# Patient Record
Sex: Male | Born: 1965 | Race: White | Hispanic: No | Marital: Single | Smoking: Current every day smoker
Health system: Southern US, Community
[De-identification: ages and names within clinical notes are randomized; demographics above are authoritative.]

## PROBLEM LIST (undated history)

## (undated) HISTORY — PX: EXTERNAL EAR SURGERY: SHX627

## (undated) HISTORY — PX: FRACTURE SURGERY: SHX138

---

## 2015-02-19 ENCOUNTER — Emergency Department (HOSPITAL_COMMUNITY)

## 2015-02-19 ENCOUNTER — Emergency Department (HOSPITAL_COMMUNITY)
Admission: EM | Admit: 2015-02-19 | Discharge: 2015-02-19 | Disposition: A | Discharge status: Home / Self Care | Attending: Emergency Medicine | Admitting: Emergency Medicine

## 2015-02-19 ENCOUNTER — Encounter (HOSPITAL_COMMUNITY): Payer: Self-pay | Admitting: *Deleted

## 2015-02-19 DIAGNOSIS — Y9368 Activity, volleyball (beach) (court): Secondary | ICD-10-CM | POA: Insufficient documentation

## 2015-02-19 DIAGNOSIS — Y9289 Other specified places as the place of occurrence of the external cause: Secondary | ICD-10-CM | POA: Diagnosis not present

## 2015-02-19 DIAGNOSIS — S52501A Unspecified fracture of the lower end of right radius, initial encounter for closed fracture: Secondary | ICD-10-CM

## 2015-02-19 DIAGNOSIS — W1839XA Other fall on same level, initial encounter: Secondary | ICD-10-CM | POA: Diagnosis not present

## 2015-02-19 DIAGNOSIS — Z880 Allergy status to penicillin: Secondary | ICD-10-CM | POA: Diagnosis not present

## 2015-02-19 DIAGNOSIS — S52591A Other fractures of lower end of right radius, initial encounter for closed fracture: Secondary | ICD-10-CM | POA: Insufficient documentation

## 2015-02-19 DIAGNOSIS — S52611A Displaced fracture of right ulna styloid process, initial encounter for closed fracture: Secondary | ICD-10-CM | POA: Insufficient documentation

## 2015-02-19 DIAGNOSIS — S6991XA Unspecified injury of right wrist, hand and finger(s), initial encounter: Secondary | ICD-10-CM | POA: Diagnosis present

## 2015-02-19 DIAGNOSIS — Y998 Other external cause status: Secondary | ICD-10-CM | POA: Diagnosis not present

## 2015-02-19 DIAGNOSIS — Z720 Tobacco use: Secondary | ICD-10-CM | POA: Insufficient documentation

## 2015-02-19 DIAGNOSIS — Q7191 Unspecified reduction defect of right upper limb: Secondary | ICD-10-CM | POA: Diagnosis not present

## 2015-02-19 MED ORDER — OXYCODONE-ACETAMINOPHEN 5-325 MG PO TABS
2.0000 | ORAL_TABLET | Freq: Once | ORAL | Status: AC
  Administered 2015-02-19: 2 via ORAL
  Filled 2015-02-19: qty 2

## 2015-02-19 MED ORDER — OXYCODONE-ACETAMINOPHEN 5-325 MG PO TABS: 1.0000 | ORAL_TABLET | Freq: Once | ORAL | Status: AC

## 2015-02-19 MED ORDER — BUPIVACAINE HCL (PF) 0.25 % IJ SOLN
10.0000 mL | Freq: Once | INTRAMUSCULAR | Status: AC
  Administered 2015-02-19: 10 mL
  Filled 2015-02-19: qty 30

## 2015-02-19 MED ORDER — IBUPROFEN 400 MG PO TABS
600.0000 mg | ORAL_TABLET | Freq: Once | ORAL | Status: AC
  Administered 2015-02-19: 600 mg via ORAL
  Filled 2015-02-19: qty 2

## 2015-02-19 NOTE — ED Notes (Signed)
MD at bedside. 

## 2015-02-19 NOTE — ED Provider Notes (Signed)
CSN: [id number]     Arrival date & time 02/19/15  1435 History  This chart was scribed for Joel Razor, MD by Ronney Lion, ED Scribe. This patient was seen in room [facility]06/[facility] and the patient's care was started at 3:15 PM.    Chief Complaint  Patient presents with  . Wrist Injury   The history is provided by the patient. No language interpreter was used.     HPI Comments: Joel Patton is a 49 y.o. male who presents to the Emergency Department complaining of constant right wrist pain radiating to his right elbow S/P a wrist injury that occurred PTA. Patient states he was playing volleyball when he fell and landed on his wrist, and he heard a snap. Patient has applied ice. He denies any other pain. Patient has known allergies to penicillin.  History reviewed. No pertinent past medical history. Past Surgical History  Procedure Laterality Date  . External ear surgery    . Fracture surgery     History reviewed. No pertinent family history. History  Substance Use Topics  . Smoking status: Current Every Day Smoker  . Smokeless tobacco: Not on file  . Alcohol Use: Yes    Review of Systems  Cardiovascular: Negative for chest pain.  Gastrointestinal: Negative for abdominal pain.  Genitourinary: Negative for flank pain.  Musculoskeletal: Positive for myalgias and arthralgias. Negative for back pain and neck pain.  All other systems reviewed and are negative.   Allergies  Penicillins  Home Medications   Prior to Admission medications   Not on File   BP 153/79 mmHg  Pulse 57  Temp(Src) 98.6 F (37 C) (Oral)  Resp 18  Ht  (1.778 m)  Wt 142 lb (64.411 kg)  BMI 20.37 kg/m2  SpO2 99% Physical Exam  Constitutional: He appears well-developed and well-nourished. No distress.  HENT:  Head: Normocephalic and atraumatic.  Eyes: Conjunctivae are normal. Right eye exhibits no discharge. Left eye exhibits no discharge.  Neck: Neck supple.  Cardiovascular: Normal rate,  regular rhythm and normal heart sounds.  Exam reveals no gallop and no friction rub.   No murmur heard. Pulmonary/Chest: Effort normal and breath sounds normal. No respiratory distress.  Abdominal: Soft. He exhibits no distension. There is no tenderness.  Musculoskeletal: He exhibits no edema or tenderness.  Deformity to right wrist. Swollen hematoma over the dorsal aspect. Closed injury. NVI.  Neurological: He is alert.  Skin: Skin is warm and dry.  Psychiatric: He has a normal mood and affect. His behavior is normal. Thought content normal.  Nursing note and vitals reviewed.   ED Course  Procedures (including critical care time)  Hematoma Block:   Consent: verbal consent obtained Indication: Pain Relief, Orthopedic Manipulation Medications: 10cc of 0.25% bupivacaine Skin overlying hematoma cleansed in typical fashion. 25g needle used. Hematoma entered over dorsal aspect of wrist and directed towards fracture site. Dark red blood aspirated prior to injecting. Pt tolerated procedure well w/o any apparent complication.  Reduction of fracture - R distal radius Date/Time: 3:59 PM Performed by: Joel Patton Authorized by: Joel Patton Consent: Verbal consent obtained. Risks and benefits: risks, benefits and alternatives were discussed Consent given by: patient Required items: required blood products, implants, devices, and special equipment available Time out: Immediately prior to procedure a "time out" was called to verify the correct patient, procedure, equipment, support staff and site/side marked as required.  Patient sedated: n/a. Hematoma block  Patient tolerance: Patient tolerated the procedure well with no immediate complications.  Joint: R wrist Reduction technique: injury "recreated" and then axial traction. Splinted.   SPLINT APPLICATION Date/Time: 4:44 PM Authorized by: Joel RazorKOHUT, Alicianna Litchford Consent: Verbal consent obtained. Risks and benefits: risks, benefits and  alternatives were discussed Consent given by: patient Splint applied by: nursing under my direct supervision Location details: R wrist Splint type: volar Supplies used: orthoglass Post-procedure: The splinted body part was neurovascularly unchanged following the procedure. Patient tolerance: Patient tolerated the procedure well with no immediate complications.      DIAGNOSTIC STUDIES: Oxygen Saturation is 99% on room air, normal by my interpretation.    COORDINATION OF CARE: 3:17 PM - Discussed treatment plan with pt at bedside which includes an injection for pain relief, and pt agreed to plan.   Medications  bupivacaine (PF) (MARCAINE) 0.25 % injection 10 mL (not administered)  oxyCODONE-acetaminophen (PERCOCET/ROXICET) 5-325 MG per tablet 2 tablet (not administered)  ibuprofen (ADVIL,MOTRIN) tablet 600 mg (not administered)    Labs Review Labs Reviewed - No data to display  Imaging Review Dg Wrist 2 Views Right  02/19/2015   CLINICAL DATA:  Post reduction  EXAM: RIGHT WRIST - 2 VIEW  COMPARISON:  Same date, pre reduction images  FINDINGS: Mildly impacted distal right radial meta diaphyseal fracture is reidentified with fracture fragments in near anatomical alignment. Splint material is in place.  IMPRESSION: Near anatomic position of right distal radial meta diaphyseal fracture fragments.   Electronically Signed   By: Christiana PellantGretchen  Green M.D.   On: 02/19/2015 16:08   Dg Wrist Complete Right  02/19/2015   CLINICAL DATA:  Larey SeatFell playing volleyball today, deformity RIGHT wrist, wrist injury  EXAM: RIGHT WRIST - COMPLETE 3+ VIEW  COMPARISON:  None  FINDINGS: Osseous mineralization normal.  Displaced fracture at tip of ulnar styloid process.  Distal radial metaphyseal fracture with dorsal displacement and apex volar angulation.  Volar tilt of distal radial articular surface.  Suspicion of extension into dorsal margin of the radiocarpal joint.  No additional fracture or dislocation.   IMPRESSION: Ulnar styloid fracture, displaced.  Mildly displaced, angulated distal RIGHT radial metaphyseal fracture with suspect intra-articular extension at the dorsal margin of the radiocarpal joint.   Electronically Signed   By: Ulyses SouthwardMark  Boles M.D.   On: 02/19/2015 15:24     EKG Interpretation None      MDM   Final diagnoses:  Reduction defect of right upper extremity  Closed fracture of distal end of right radius, initial encounter    48yM with closed distal R wrist fx. Hematoma block, reduction, splinted. Remains NVI. PRN pain meds. Ortho FU.     Joel RazorStephen Eon Zunker, MD 02/19/15 [phone]

## 2015-02-19 NOTE — ED Notes (Addendum)
[facility].  Pt was playing volleyball and fell, has deformity of wrist. Good radial pulse

## 2015-02-19 NOTE — Discharge Instructions (Signed)
Cast or Splint Care °Casts and splints support injured limbs and keep bones from moving while they heal. It is important to care for your cast or splint at home.   °HOME CARE INSTRUCTIONS °· Keep the cast or splint uncovered during the drying period. It can take 24 to 48 hours to dry if it is made of plaster. A fiberglass cast will dry in less than 1 hour. °· Do not rest the cast on anything harder than a pillow for the first 24 hours. °· Do not put weight on your injured limb or apply pressure to the cast until your health care provider gives you permission. °· Keep the cast or splint dry. Wet casts or splints can lose their shape and may not support the limb as well. A wet cast that has lost its shape can also create harmful pressure on your skin when it dries. Also, wet skin can become infected. °¨ Cover the cast or splint with a plastic bag when bathing or when out in the rain or snow. If the cast is on the trunk of the body, take sponge baths until the cast is removed. °¨ If your cast does become wet, dry it with a towel or a blow dryer on the cool setting only. °· Keep your cast or splint clean. Soiled casts may be wiped with a moistened cloth. °· Do not place any hard or soft foreign objects under your cast or splint, such as cotton, toilet paper, lotion, or powder. °· Do not try to scratch the skin under the cast with any object. The object could get stuck inside the cast. Also, scratching could lead to an infection. If itching is a problem, use a blow dryer on a cool setting to relieve discomfort. °· Do not trim or cut your cast or remove padding from inside of it. °· Exercise all joints next to the injury that are not immobilized by the cast or splint. For example, if you have a long leg cast, exercise the hip joint and toes. If you have an arm cast or splint, exercise the shoulder, elbow, thumb, and fingers. °· Elevate your injured arm or leg on 1 or 2 pillows for the first 1 to 3 days to decrease  swelling and pain. It is best if you can comfortably elevate your cast so it is higher than your heart. °SEEK MEDICAL CARE IF:  °· Your cast or splint cracks. °· Your cast or splint is too tight or too loose. °· You have unbearable itching inside the cast. °· Your cast becomes wet or develops a soft spot or area. °· You have a bad smell coming from inside your cast. °· You get an object stuck under your cast. °· Your skin around the cast becomes red or raw. °· You have new pain or worsening pain after the cast has been applied. °SEEK IMMEDIATE MEDICAL CARE IF:  °· You have fluid leaking through the cast. °· You are unable to move your fingers or toes. °· You have discolored (blue or white), cool, painful, or very swollen fingers or toes beyond the cast. °· You have tingling or numbness around the injured area. °· You have severe pain or pressure under the cast. °· You have any difficulty with your breathing or have shortness of breath. °· You have chest pain. °Document Released: 11/10/2000 Document Revised: 09/03/2013 Document Reviewed: 05/22/2013 °[employer]® Patient Information ©2015 [employer]. This information is not intended to replace advice given to you by your health care   provider. Make sure you discuss any questions you have with your health care provider. ° °Forearm Fracture °The forearm is between your elbow and your wrist. It has two bones (ulna and radius). A fracture is a break in one or both of these bones. °HOME CARE °· Raise (elevate) your arm above the level of the heart. °· Put ice on the injured area. °¨ Put ice in a plastic bag. °¨ Place a towel between the skin and the bag. °¨ Leave the ice on for 15-20 minutes, 03-04 times a day. °· If given a plaster or fiberglass cast: °¨ Do not try to scratch the skin under the cast with sharp or pointed objects. °¨ Check the skin around the cast every day. You may put lotion on any red or sore areas. °¨ Keep the cast dry and clean. °· If given a plaster  splint: °¨ Wear the splint as told. °¨ You may loosen the elastic around the splint if the fingers become numb, tingle, or turn cold or blue. °· Do not put pressure on any part of the cast or splint. It may break. Rest the cast only on a pillow the first 24 hours until it is fully hardened. °· The cast or splint can be protected during bathing with a plastic bag. Do not lower the cast or splint into water. °· Only take medicine as told by your doctor. °GET HELP RIGHT AWAY IF:  °· The cast gets damaged or breaks. °· You have pain or puffiness (swelling). °· The skin or nails below the injury turn blue or gray, or feel cold or numb. °· There is a bad smell, new stains, or fluid coming from under the cast. °MAKE SURE YOU:  °· Understand these instructions. °· Will watch your condition. °· Will get help right away if you are not doing well or get worse. °Document Released: 05/01/2008 Document Revised: 02/05/2012 Document Reviewed: 05/01/2008 °[employer]® Patient Information ©2015 [employer]. This information is not intended to replace advice given to you by your health care provider. Make sure you discuss any questions you have with your health care provider. ° °

## 2016-06-20 IMAGING — DX DG WRIST COMPLETE 3+V*R*
4 series · 4 of 4 positions shown · non-contrast
Comparison: None

CLINICAL DATA: Fell playing volleyball today, deformity RIGHT
wrist, wrist injury

EXAM:
RIGHT WRIST - COMPLETE 3+ VIEW

[wrist pa]
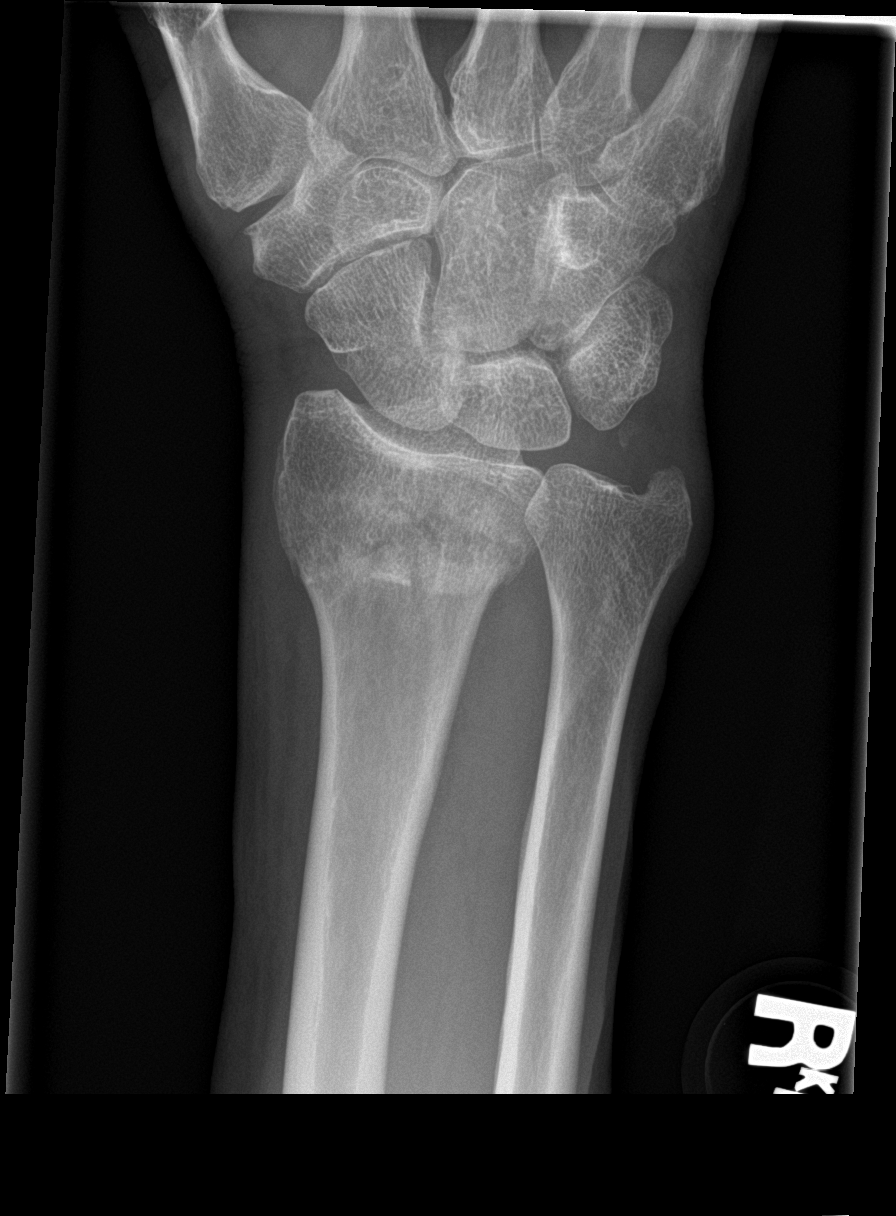

[wrist obl]
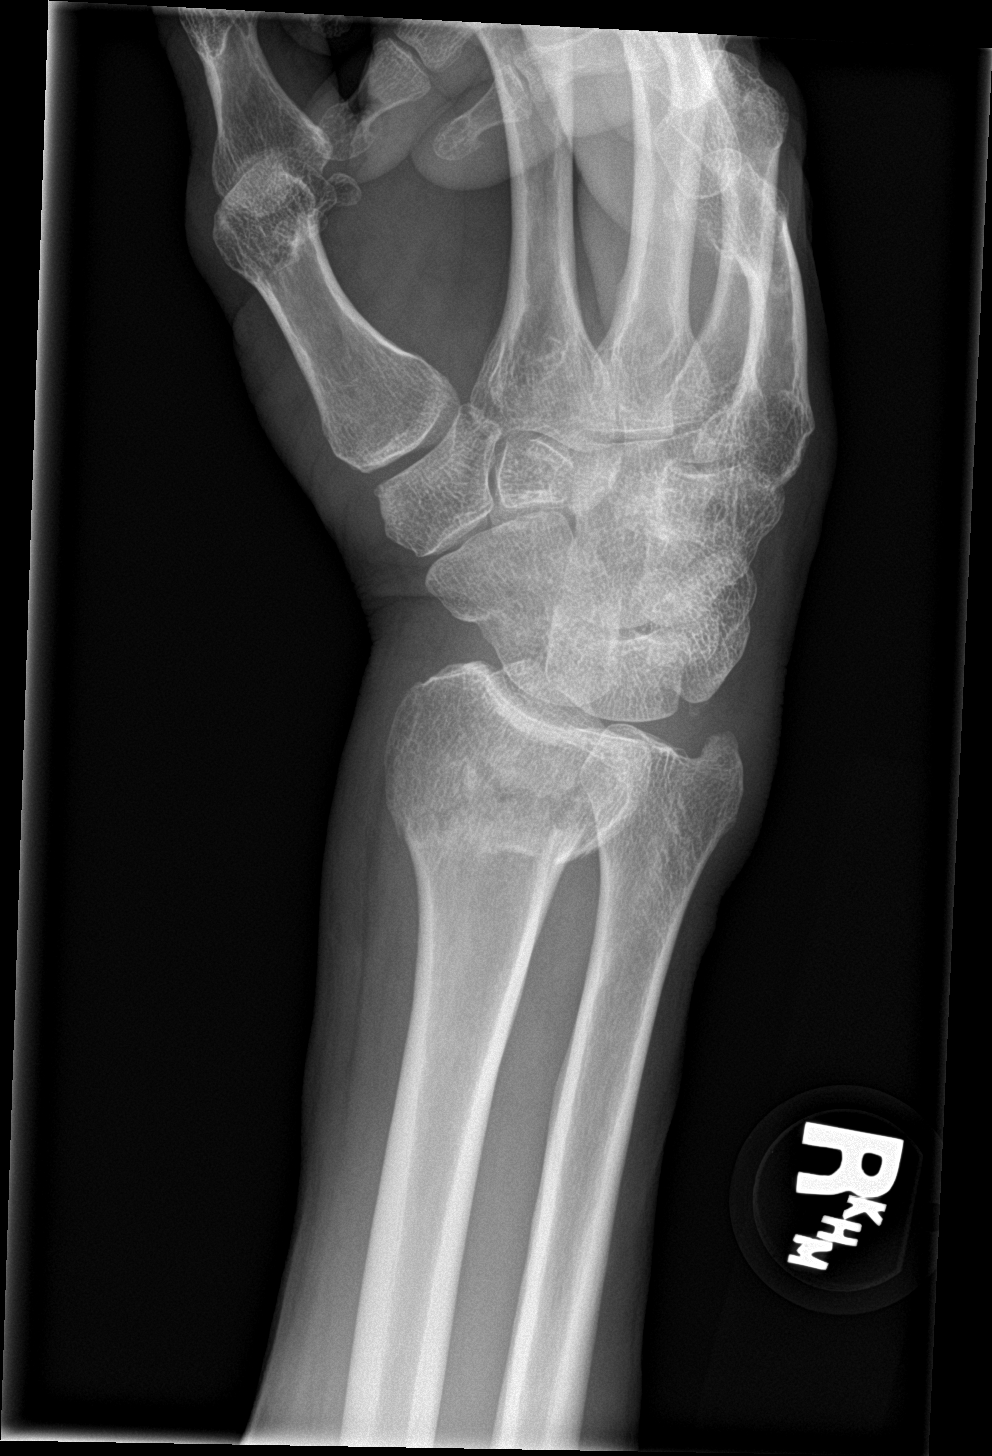

[wrist lat]
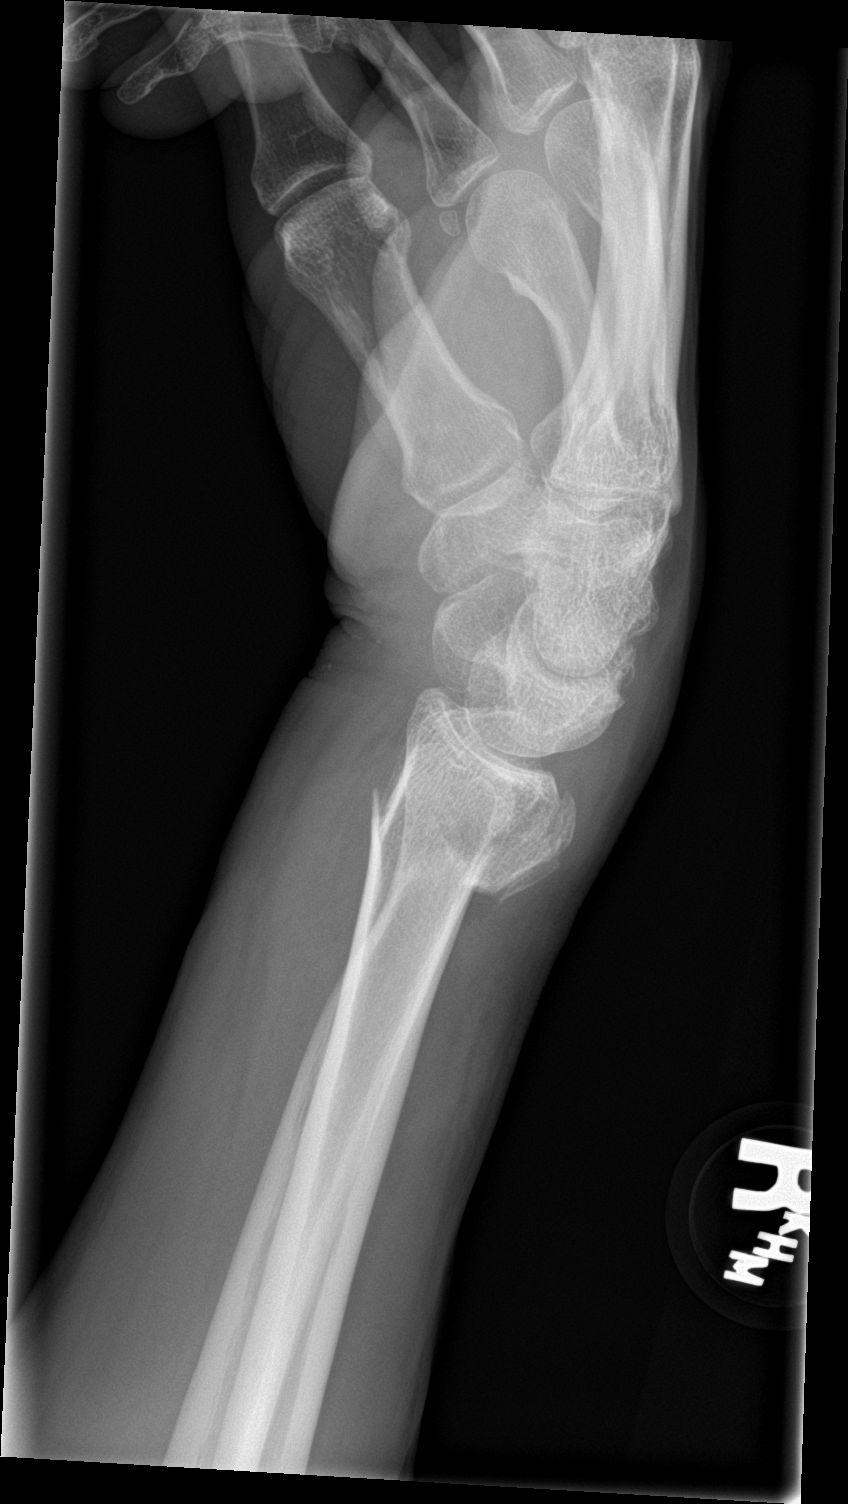

[wrist navicular]
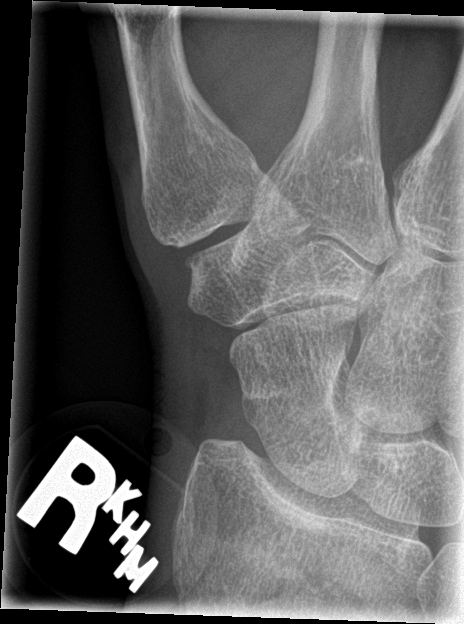

[4 of 4 positions shown; findings below may reference images not displayed]

FINDINGS: Osseous mineralization normal.

Displaced fracture at tip of ulnar styloid process.

Distal radial metaphyseal fracture with dorsal displacement and apex
volar angulation.

Volar tilt of distal radial articular surface.

Suspicion of extension into dorsal margin of the radiocarpal joint.

No additional fracture or dislocation.
IMPRESSION: Ulnar styloid fracture, displaced.

Mildly displaced, angulated distal RIGHT radial metaphyseal fracture
with suspect intra-articular extension at the dorsal margin of the
radiocarpal joint.

## 2016-06-20 IMAGING — CR DG WRIST 2V*R*
1 series · 2 of 2 positions shown · non-contrast
Comparison: Same date, pre reduction images

CLINICAL DATA: Post reduction

EXAM:
RIGHT WRIST - 2 VIEW

[Series 2: pa · 0.17mm/px · 2 of 2 slices shown]
[im 1/2]
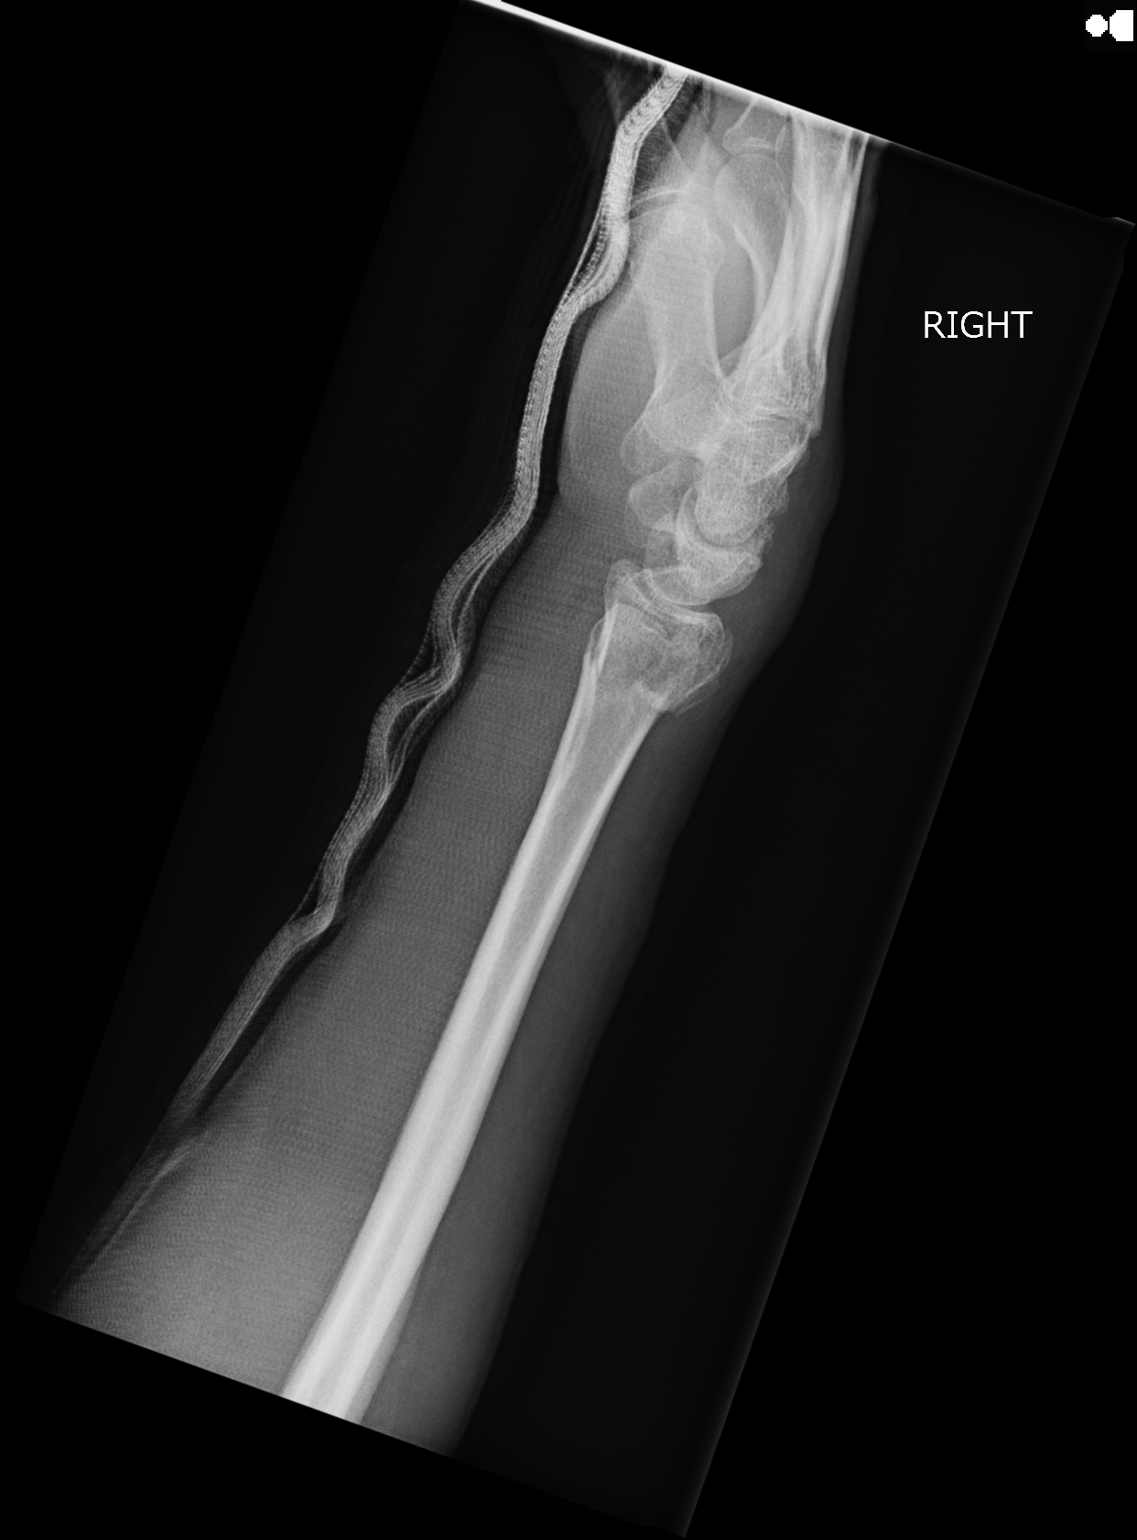
[im 2/2]
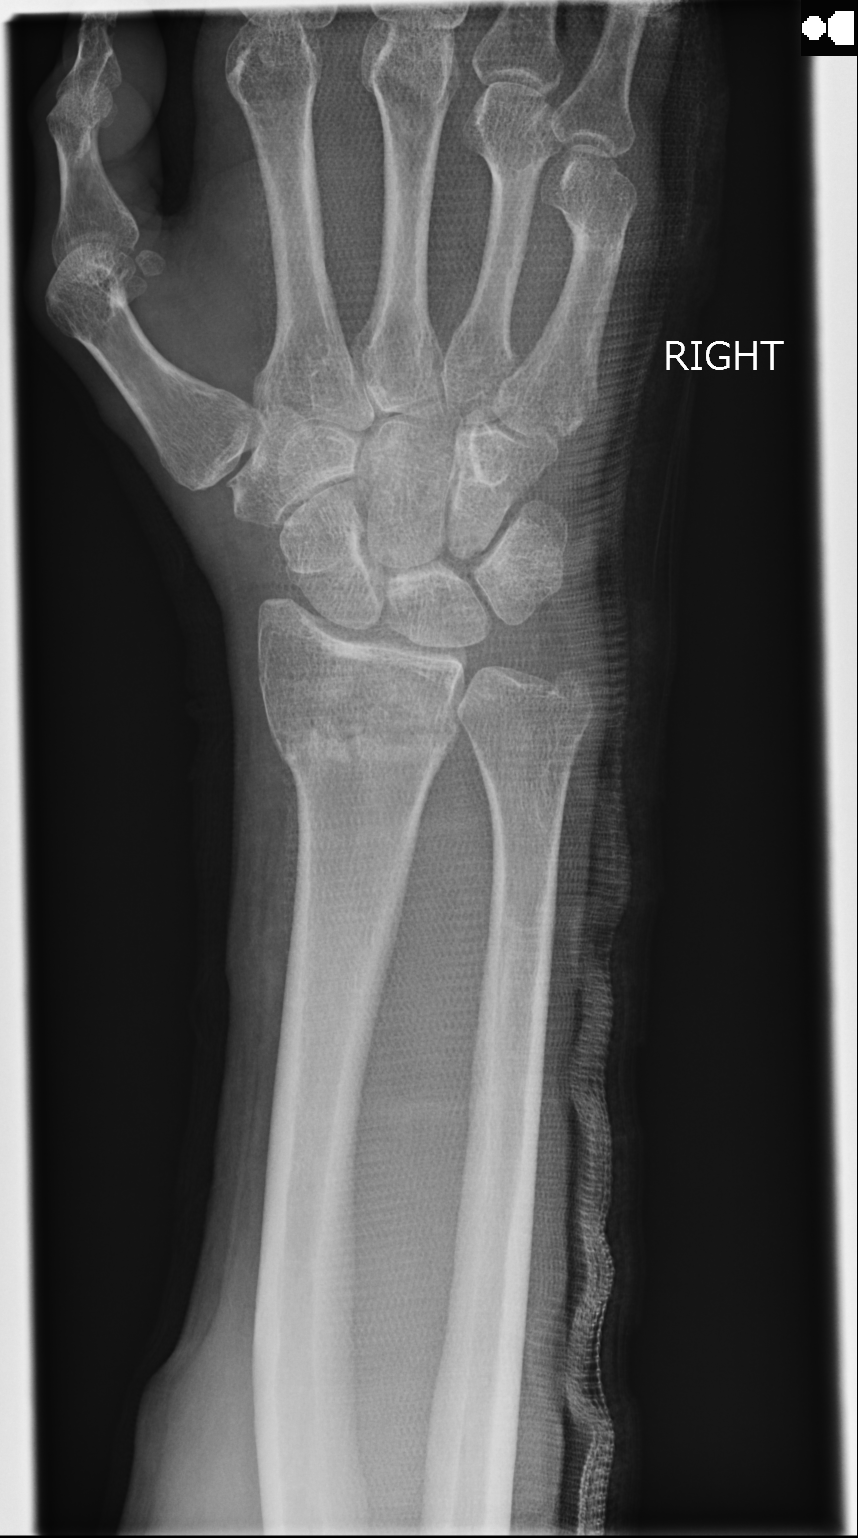

[2 of 2 positions shown; findings below may reference images not displayed]

FINDINGS: Mildly impacted distal right radial meta diaphyseal fracture is
reidentified with fracture fragments in near anatomical alignment.
Splint material is in place.
IMPRESSION: Near anatomic position of right distal radial meta diaphyseal
fracture fragments.

## 2019-05-28 DEATH — deceased
